# Patient Record
Sex: Male | Born: 1960 | Race: White | Hispanic: No | Marital: Married | Smoking: Never smoker
Health system: Southern US, Community
[De-identification: ages and names within clinical notes are randomized; demographics above are authoritative.]

---

## 1997-09-07 ENCOUNTER — Ambulatory Visit (HOSPITAL_BASED_OUTPATIENT_CLINIC_OR_DEPARTMENT_OTHER): Admission: RE | Admit: 1997-09-07 | Discharge: 1997-09-07 | Payer: Self-pay | Admitting: General Surgery

## 2003-06-09 ENCOUNTER — Emergency Department (HOSPITAL_COMMUNITY): Admission: EM | Admit: 2003-06-09 | Discharge: 2003-06-09 | Payer: Self-pay | Admitting: *Deleted

## 2004-08-26 ENCOUNTER — Ambulatory Visit (HOSPITAL_COMMUNITY): Admission: RE | Admit: 2004-08-26 | Discharge: 2004-08-26 | Payer: Self-pay | Admitting: Family Medicine

## 2017-12-16 ENCOUNTER — Encounter: Payer: Self-pay | Admitting: Cardiology

## 2018-08-10 ENCOUNTER — Other Ambulatory Visit: Payer: Self-pay

## 2018-08-10 ENCOUNTER — Ambulatory Visit (INDEPENDENT_AMBULATORY_CARE_PROVIDER_SITE_OTHER): Payer: 59 | Admitting: Cardiology

## 2018-08-10 ENCOUNTER — Encounter: Payer: Self-pay | Admitting: Cardiology

## 2018-08-10 DIAGNOSIS — E782 Mixed hyperlipidemia: Secondary | ICD-10-CM | POA: Diagnosis not present

## 2018-08-10 DIAGNOSIS — R079 Chest pain, unspecified: Secondary | ICD-10-CM

## 2018-08-10 DIAGNOSIS — R0789 Other chest pain: Secondary | ICD-10-CM | POA: Diagnosis not present

## 2018-08-10 NOTE — Patient Instructions (Addendum)
Medication Instructions:  Your physician recommends that you continue on your current medications as directed. Please refer to the Current Medication list given to you today.  If you need a refill on your cardiac medications before your next appointment, please call your pharmacy.   Lab work: NONE If you have labs (blood work) drawn today and your tests are completely normal, you will receive your results only by: Marland Kitchen MyChart Message (if you have MyChart) OR . A paper copy in the mail If you have any lab test that is abnormal or we need to change your treatment, we will call you to review the results.  Testing/Procedures You had an EKG performed today.  You are scheduled to have CT calcium score procedure performed on Sept. 2, 2020 at 4 pm.               . THERE IS A $150 balance due on day of procedure.    Your physician has requested that you have a lexiscan myoview. For further information please visit [email or web address]. Please follow instruction sheet, as given.   Follow-Up: At [facility], you and your health needs are our priority.  As part of our continuing mission to provide you with exceptional heart care, we have created designated Provider Care Teams.  These Care Teams include your primary Cardiologist (physician) and Advanced Practice Providers (APPs -  Physician Assistants and Nurse Practitioners) who all work together to provide you with the care you need, when you need it. You will need a follow up appointment in 3 months.   Any Other Special Instructions Will Be Listed Below   Coronary Calcium Scan A coronary calcium scan is an imaging test used to look for deposits of calcium and other fatty materials (plaques) in the inner lining of the blood vessels of the heart (coronary arteries). These deposits of calcium and plaques can partly clog and narrow the coronary arteries without producing any symptoms or warning signs. This puts a person at risk for a heart attack. This  test can detect these deposits before symptoms develop. Tell a health care provider about:  Any allergies you have.  All medicines you are taking, including vitamins, herbs, eye drops, creams, and over-the-counter medicines.  Any problems you or family members have had with anesthetic medicines.  Any blood disorders you have.  Any surgeries you have had.  Any medical conditions you have.  Whether you are pregnant or may be pregnant. What are the risks? Generally, this is a safe procedure. However, problems may occur, including:  Harm to a pregnant woman and her unborn baby. This test involves the use of radiation. Radiation exposure can be dangerous to a pregnant woman and her unborn baby. If you are pregnant, you generally should not have this procedure done.  Slight increase in the risk of cancer. This is because of the radiation involved in the test. What happens before the procedure? No preparation is needed for this procedure. What happens during the procedure?   You will undress and remove any jewelry around your neck or chest.  You will put on a hospital gown.  Sticky electrodes will be placed on your chest. The electrodes will be connected to an electrocardiogram (ECG) machine to record a tracing of the electrical activity of your heart.  A CT scanner will take pictures of your heart. During this time, you will be asked to lie still and hold your breath for 2-3 seconds while a picture of your heart is being  taken. The procedure may vary among health care providers and hospitals. What happens after the procedure?  You can get dressed.  You can return to your normal activities.  It is up to you to get the results of your test. Ask your health care provider, or the department that is doing the test, when your results will be ready. Summary  A coronary calcium scan is an imaging test used to look for deposits of calcium and other fatty materials (plaques) in the inner  lining of the blood vessels of the heart (coronary arteries).  Generally, this is a safe procedure. Tell your health care provider if you are pregnant or may be pregnant.  No preparation is needed for this procedure.  A CT scanner will take pictures of your heart.  You can return to your normal activities after the scan is done. This information is not intended to replace advice given to you by your health care provider. Make sure you discuss any questions you have with your health care provider. Document Released: 06/20/2007 Document Revised: 12/04/2016 Document Reviewed: 11/11/2015 Elsevier Patient Education  2020 Elsevier Inc.   Regadenoson injection What is this medicine? REGADENOSON is used to test the heart for coronary artery disease. It is used in patients who can not exercise for their stress test. This medicine may be used for other purposes; ask your health care provider or pharmacist if you have questions. COMMON BRAND NAME(S): Lexiscan What should I tell my health care provider before I take this medicine? They need to know if you have any of these conditions:  heart problems  lung or breathing disease, like asthma or COPD  an unusual or allergic reaction to regadenoson, other medicines, foods, dyes, or preservatives  pregnant or trying to get pregnant  breast-feeding How should I use this medicine? This medicine is for injection into a vein. It is given by a health care professional in a hospital or clinic setting. Talk to your pediatrician regarding the use of this medicine in children. Special care may be needed. Overdosage: If you think you have taken too much of this medicine contact a poison control center or emergency room at once. NOTE: This medicine is only for you. Do not share this medicine with others. What if I miss a dose? This does not apply. What may interact with this medicine?  caffeine  dipyridamole  guarana  theophylline This list may  not describe all possible interactions. Give your health care provider a list of all the medicines, herbs, non-prescription drugs, or dietary supplements you use. Also tell them if you smoke, drink alcohol, or use illegal drugs. Some items may interact with your medicine. What should I watch for while using this medicine? Your condition will be monitored carefully while you are receiving this medicine. Do not take medicines, foods, or drinks with caffeine (like coffee, tea, or colas) for at least 12 hours before your test. If you do not know if something contains caffeine, ask your health care professional. What side effects may I notice from receiving this medicine? Side effects that you should report to your doctor or health care professional as soon as possible:  allergic reactions like skin rash, itching or hives, swelling of the face, lips, or tongue  breathing problems  chest pain, tightness or palpitations  severe headache Side effects that usually do not require medical attention (report to your doctor or health care professional if they continue or are bothersome):  flushing  headache  irritation  or pain at site where injected  nausea, vomiting This list may not describe all possible side effects. Call your doctor for medical advice about side effects. You may report side effects to FDA at 1-800-FDA-1088. Where should I keep my medicine? This drug is given in a hospital or clinic and will not be stored at home. NOTE: This sheet is a summary. It may not cover all possible information. If you have questions about this medicine, talk to your doctor, pharmacist, or health care provider.  2020 Elsevier/Gold Standard (2007-08-22 15:08:13)    Cardiac Nuclear Scan A cardiac nuclear scan is a test that is done to check the flow of blood to your heart. It is done when you are resting and when you are exercising. The test looks for problems such as:  Not enough blood reaching a  portion of the heart.  The heart muscle not working as it should. You may need this test if:  You have heart disease.  You have had lab results that are not normal.  You have had heart surgery or a balloon procedure to open up blocked arteries (angioplasty).  You have chest pain.  You have shortness of breath. In this test, a special dye (tracer) is put into your bloodstream. The tracer will travel to your heart. A camera will then take pictures of your heart to see how the tracer moves through your heart. This test is usually done at a hospital and takes 2-4 hours. Tell a doctor about:  Any allergies you have.  All medicines you are taking, including vitamins, herbs, eye drops, creams, and over-the-counter medicines.  Any problems you or family members have had with anesthetic medicines.  Any blood disorders you have.  Any surgeries you have had.  Any medical conditions you have.  Whether you are pregnant or may be pregnant. What are the risks? Generally, this is a safe test. However, problems may occur, such as:  Serious chest pain and heart attack. This is only a risk if the stress portion of the test is done.  Rapid heartbeat.  A feeling of warmth in your chest. This feeling usually does not last long.  Allergic reaction to the tracer. What happens before the test?  Ask your doctor about changing or stopping your normal medicines. This is important.  Follow instructions from your doctor about what you cannot eat or drink.  Remove your jewelry on the day of the test. What happens during the test?  An IV tube will be inserted into one of your veins.  Your doctor will give you a small amount of tracer through the IV tube.  You will wait for 20-40 minutes while the tracer moves through your bloodstream.  Your heart will be monitored with an electrocardiogram (ECG).  You will lie down on an exam table.  Pictures of your heart will be taken for about 15-20  minutes.  You may also have a stress test. For this test, one of these things may be done: ? You will be asked to exercise on a treadmill or a stationary bike. ? You will be given medicines that will make your heart work harder. This is done if you are unable to exercise.  When blood flow to your heart has peaked, a tracer will again be given through the IV tube.  After 20-40 minutes, you will get back on the exam table. More pictures will be taken of your heart.  Depending on the tracer that is used, more  pictures may need to be taken 3-4 hours later.  Your IV tube will be removed when the test is over. The test may vary among doctors and hospitals. What happens after the test?  Ask your doctor: ? Whether you can return to your normal schedule, including diet, activities, and medicines. ? Whether you should drink more fluids. This will help to remove the tracer from your body. Drink enough fluid to keep your pee (urine) pale yellow.  Ask your doctor, or the department that is doing the test: ? When will my results be ready? ? How will I get my results? Summary  A cardiac nuclear scan is a test that is done to check the flow of blood to your heart.  Tell your doctor whether you are pregnant or may be pregnant.  Before the test, ask your doctor about changing or stopping your normal medicines. This is important.  Ask your doctor whether you can return to your normal activities. You may be asked to drink more fluids. This information is not intended to replace advice given to you by your health care provider. Make sure you discuss any questions you have with your health care provider. Document Released: 06/07/2017 Document Revised: 04/13/2018 Document Reviewed: 06/07/2017 Elsevier Patient Education  2020 [employer]

## 2018-08-10 NOTE — Addendum Note (Signed)
Addended by: Beckey Rutter on: 08/10/2018 09:12 AM   Modules accepted: Orders

## 2018-08-10 NOTE — Progress Notes (Signed)
Cardiology Office Note:    Date:  08/10/2018   ID:  Chris Gonzalez, DOB May 07, 1960, MRN [id number]  PCP:  Tally JoeSwayne, David, MD  Cardiologist:  Garwin Brothersajan R Revankar, MD   Referring MD: Clementeen GrahamScifres, Dorothy, PA-C    ASSESSMENT:    1. Chest discomfort   2. Mixed dyslipidemia    PLAN:    In order of problems listed above:  1. Chest discomfort: Patient symptoms are atypical.  However in view of multiple risk factors I will set him up for a Lexiscan sestamibi.  He knows to go to the nearest emergency room for any concerning symptoms.  I also mentioned to him that it would be a good idea to get a calcium score for risk stratification and he is agreeable. 2. Mixed dyslipidemia: Diet was discussed for dyslipidemia.  Lipids are managed by his primary care physician.  I told him in view of risk factors it might be a better idea to get a more potent statin such as rosuvastatin or atorvastatin in view of current guidelines and he will discuss this with his primary care physician. 3. Patient will be seen in follow-up appointment in 3 months or earlier if the patient has any concerns    Medication Adjustments/Labs and Tests Ordered: Current medicines are reviewed at length with the patient today.  Concerns regarding medicines are outlined above.  No orders of the defined types were placed in this encounter.  No orders of the defined types were placed in this encounter.    History of Present Illness:    Chris ParrGeoffrey Gonzalez is a 58 y.o. male who is being seen today for the evaluation of chest discomfort at the request of Scifres, Nicole CellaDorothy, [place]C.  Patient is a pleasant [age] year old male.  She has past medical history of mixed dyslipidemia.  He mentions to me that he had chest tightness on 2 occasions.  He woke up from his sleep once.  No radiation to the neck or to the arms.  He also had another episode when he was working on his computer.  These do not come on exertion.  No orthopnea or PND.  He mows his  grass with a push mower without any symptoms.  At the time of my evaluation, the patient is alert awake oriented and in no distress.  History reviewed. No pertinent past medical history.  History reviewed. No pertinent surgical history.  Current Medications: Current Meds  Medication Sig  . cetirizine (ZYRTEC) 10 MG tablet Take 10 mg by mouth daily.  Marland Kitchen. desonide (DESOWEN) 0.05 % cream Apply topically 2 (two) times daily.  Marland Kitchen. lovastatin (MEVACOR) 40 MG tablet Take 40 mg by mouth daily.  . Multiple Vitamins-Minerals (MULTIVITAMIN WITH MINERALS) tablet Take 1 tablet by mouth daily.     Allergies:   Tadalafil   Social History   Socioeconomic History  . Marital status: Married    Spouse name: Not on file  . Number of children: Not on file  . Years of education: Not on file  . Highest education level: Not on file  Occupational History  . Not on file  Social Needs  . Financial resource strain: Not on file  . Food insecurity    Worry: Not on file    Inability: Not on file  . Transportation needs    Medical: Not on file    Non-medical: Not on file  Tobacco Use  . Smoking status: Never Smoker  . Smokeless tobacco: Never Used  Substance and Sexual Activity  .  Alcohol use: Yes  . Drug use: Never  . Sexual activity: Not on file  Lifestyle  . Physical activity    Days per week: Not on file    Minutes per session: Not on file  . Stress: Not on file  Relationships  . Social Herbalist on phone: Not on file    Gets together: Not on file    Attends religious service: Not on file    Active member of club or organization: Not on file    Attends meetings of clubs or organizations: Not on file    Relationship status: Not on file  Other Topics Concern  . Not on file  Social History Narrative  . Not on file     Family History: The patient's family history includes Arrhythmia in his mother; Hypertension in his mother.  ROS:   Please see the history of present illness.     All other systems reviewed and are negative.  EKGs/Labs/Other Studies Reviewed:    The following studies were reviewed today: EKG reveals sinus rhythm and nonspecific ST-T changes   Recent Labs: No results found for requested labs within last 8760 hours.  Recent Lipid Panel No results found for: CHOL, TRIG, HDL, CHOLHDL, VLDL, LDLCALC, LDLDIRECT  Physical Exam:    VS:  BP 128/76   Pulse 67   Ht 6\' 1"  (1.854 m)   Wt 214 lb (97.1 kg)   SpO2 97%   BMI 28.23 kg/m     Wt Readings from Last 3 Encounters:  08/10/18 214 lb (97.1 kg)     GEN: Patient is in no acute distress HEENT: Normal NECK: No JVD; No carotid bruits LYMPHATICS: No lymphadenopathy CARDIAC: S1 S2 regular, 2/6 systolic murmur at the apex. RESPIRATORY:  Clear to auscultation without rales, wheezing or rhonchi  ABDOMEN: Soft, non-tender, non-distended MUSCULOSKELETAL:  No edema; No deformity  SKIN: Warm and dry NEUROLOGIC:  Alert and oriented x 3 PSYCHIATRIC:  Normal affect    Signed, Jenean Lindau, MD  08/10/2018 8:56 AM    [place]

## 2018-08-16 ENCOUNTER — Telehealth (HOSPITAL_COMMUNITY): Payer: Self-pay

## 2018-08-16 NOTE — Telephone Encounter (Signed)
Instructions for the nuclear stress test was left on the patient's answering machine. Asked the patient to call back with any questions. S.Rashidi Loh EMTP

## 2018-08-18 ENCOUNTER — Other Ambulatory Visit: Payer: Self-pay

## 2018-08-18 ENCOUNTER — Ambulatory Visit (HOSPITAL_COMMUNITY): Payer: No Typology Code available for payment source | Attending: Cardiology

## 2018-08-18 VITALS — Ht 73.0 in | Wt 214.0 lb

## 2018-08-18 DIAGNOSIS — R079 Chest pain, unspecified: Secondary | ICD-10-CM

## 2018-08-18 DIAGNOSIS — R0789 Other chest pain: Secondary | ICD-10-CM | POA: Insufficient documentation

## 2018-08-18 LAB — MYOCARDIAL PERFUSION IMAGING
LV dias vol: 105 mL (ref 62–150)
LV sys vol: 44 mL
Peak HR: 85 {beats}/min
Rest HR: 60 {beats}/min
SDS: 1
SRS: 0
SSS: 1
TID: 1.09

## 2018-08-18 MED ORDER — TECHNETIUM TC 99M TETROFOSMIN IV KIT
31.7000 | PACK | Freq: Once | INTRAVENOUS | Status: AC | PRN
  Administered 2018-08-18: 31.7 via INTRAVENOUS
  Filled 2018-08-18: qty 32

## 2018-08-18 MED ORDER — REGADENOSON 0.4 MG/5ML IV SOLN
0.4000 mg | Freq: Once | INTRAVENOUS | Status: AC
  Administered 2018-08-18: 0.4 mg via INTRAVENOUS

## 2018-08-18 MED ORDER — TECHNETIUM TC 99M TETROFOSMIN IV KIT
10.1000 | PACK | Freq: Once | INTRAVENOUS | Status: AC | PRN
  Administered 2018-08-18: 10.1 via INTRAVENOUS
  Filled 2018-08-18: qty 11

## 2018-08-22 ENCOUNTER — Encounter: Payer: Self-pay | Admitting: *Deleted

## 2018-09-07 ENCOUNTER — Ambulatory Visit (INDEPENDENT_AMBULATORY_CARE_PROVIDER_SITE_OTHER)
Admission: RE | Admit: 2018-09-07 | Discharge: 2018-09-07 | Disposition: A | Discharge status: Home / Self Care | Payer: Self-pay | Source: Ambulatory Visit | Attending: Cardiology | Admitting: Cardiology

## 2018-09-07 ENCOUNTER — Other Ambulatory Visit: Payer: Self-pay

## 2018-09-07 DIAGNOSIS — R079 Chest pain, unspecified: Secondary | ICD-10-CM

## 2018-09-08 ENCOUNTER — Telehealth: Payer: Self-pay

## 2018-09-08 NOTE — Telephone Encounter (Signed)
Information relayed, patient states he will touch base with Dr. Marquette Saa office tomorrow. Copy of results sent to Dr. Moreen Fowler.

## 2018-09-08 NOTE — Telephone Encounter (Signed)
-----   Message from Jenean Lindau, MD sent at 09/08/2018  8:08 AM EDT ----- The results of the study is unremarkable.  The CT scan report is excellent as far as heart is concerned.  He has nodules in the lungs which appear unremarkable but this will have to be followed by his primary care physician and please tell him to get in touch with them and send copy to primary care.  Please inform patient. I will discuss in detail at next appointment. Cc  primary care/referring physician Jenean Lindau, MD 09/08/2018 8:07 AM

## 2018-11-22 ENCOUNTER — Ambulatory Visit: Payer: 59 | Admitting: Cardiology

## 2018-11-25 ENCOUNTER — Other Ambulatory Visit: Payer: Self-pay

## 2018-11-25 ENCOUNTER — Encounter: Payer: Self-pay | Admitting: Cardiology

## 2018-11-25 ENCOUNTER — Ambulatory Visit (INDEPENDENT_AMBULATORY_CARE_PROVIDER_SITE_OTHER): Payer: 59 | Admitting: Cardiology

## 2018-11-25 VITALS — BP 136/84 | HR 76 | Ht 73.0 in | Wt 213.0 lb

## 2018-11-25 DIAGNOSIS — E782 Mixed hyperlipidemia: Secondary | ICD-10-CM

## 2018-11-25 DIAGNOSIS — Z1329 Encounter for screening for other suspected endocrine disorder: Secondary | ICD-10-CM

## 2018-11-25 DIAGNOSIS — R0789 Other chest pain: Secondary | ICD-10-CM | POA: Diagnosis not present

## 2018-11-25 NOTE — Progress Notes (Signed)
Cardiology Office Note:    Date:  11/25/2018   ID:  Chris Gonzalez, DOB 1960-04-02, MRN [id number]  PCP:  Tally Joe, MD  Cardiologist:  Garwin Brothers, MD   Referring MD: Tally Joe, MD    ASSESSMENT:    1. Chest discomfort   2. Mixed dyslipidemia    PLAN:    In order of problems listed above:  1. Chest discomfort: This has resolved.  I congratulated him on his active exercise program and he is happy with it. 2. Mixed dyslipidemia: We will get lipid check in the next few days in addition to other blood work.  I will make no recommendations for continuation or discontinuation of lipid therapy based on these numbers and the results of the CT calcium scoring.  I discussed the plan with the patient at length. 3. Patient will be seen in follow-up appointment in 12 months or earlier if the patient has any concerns.  I discussed with the patient about his CT scan report at length and mentioned to him to talk to his primary care doctor about the extracardiac findings.  We will send a copy to his primary care physician.    Medication Adjustments/Labs and Tests Ordered: Current medicines are reviewed at length with the patient today.  Concerns regarding medicines are outlined above.  No orders of the defined types were placed in this encounter.  No orders of the defined types were placed in this encounter.    No chief complaint on file.    History of Present Illness:    Chris Gonzalez is a 58 y.o. male.  Patient was evaluated for chest discomfort and dyslipidemia.  He denies any problems at this time and takes care of activities of daily living.  No chest pain orthopnea or PND.  At the time of my evaluation, the patient is alert awake oriented and in no distress.  His stress test and CT score results are excellent.  History reviewed. No pertinent past medical history.  History reviewed. No pertinent surgical history.  Current Medications: No outpatient  medications have been marked as taking for the 11/25/18 encounter (Office Visit) with Ambriana Selway, Aundra Dubin, MD.     Allergies:   Tadalafil   Social History   Socioeconomic History  . Marital status: Married    Spouse name: Not on file  . Number of children: Not on file  . Years of education: Not on file  . Highest education level: Not on file  Occupational History  . Not on file  Social Needs  . Financial resource strain: Not on file  . Food insecurity    Worry: Not on file    Inability: Not on file  . Transportation needs    Medical: Not on file    Non-medical: Not on file  Tobacco Use  . Smoking status: Never Smoker  . Smokeless tobacco: Never Used  Substance and Sexual Activity  . Alcohol use: Yes  . Drug use: Never  . Sexual activity: Not on file  Lifestyle  . Physical activity    Days per week: Not on file    Minutes per session: Not on file  . Stress: Not on file  Relationships  . Social Musician on phone: Not on file    Gets together: Not on file    Attends religious service: Not on file    Active member of club or organization: Not on file    Attends meetings of clubs or organizations:  Not on file    Relationship status: Not on file  Other Topics Concern  . Not on file  Social History Narrative  . Not on file     Family History: The patient's family history includes Arrhythmia in his mother; Hypertension in his mother.  ROS:   Please see the history of present illness.    All other systems reviewed and are negative.  EKGs/Labs/Other Studies Reviewed:    The following studies were reviewed today: Study Highlights    Nuclear stress EF: 58%.  There was no ST segment deviation noted during stress.  The study is normal.  This is a low risk study.  The left ventricular ejection fraction is normal (55-65%).   Normal stress nuclear study with no ischemia or infarction.  Gated ejection fraction 58% with normal wall motion.   CT  CARDIAC SCORING (Accession [id number]) (Order [id number]) Imaging Date: 08/10/2018 [facility] Ordering/Authorizing: Garwin Brothersevankar, Tim Corriher R, MD  Exam Information  Status Exam Begun  Exam Ended   Final [99] 09/07/2018 3:50 PM 09/07/2018 4:04 PM  PACS Intelerad Image Link  Show images for CT CARDIAC SCORING  Addendum  ADDENDUM REPORT: 09/07/2018 17:15  EXAM: OVER-READ INTERPRETATION  CT CHEST  The following report is an over-read performed by radiologist Dr. Leatha GildingEric Mansellof [facility] on 09/07/2018. This over-read does not include interpretation of cardiac or coronary anatomy or pathology. The coronary calcium score interpretation by the cardiologist is attached.  COMPARISON:  None.  FINDINGS: No evidence for mediastinal or hilar lymphadenopathy within the visualized portions of the chest on this noncontrast exam. Visualized esophagus is unremarkable.  4 mm subpleural right lower lobe nodule visible on 33/3. 3 mm right lower lobe nodule identified on 38/3. 3 mm left lower lobe nodule evident on 45/3.  No worrisome lytic or sclerotic osseous abnormality.  IMPRESSION: Tiny bilateral pulmonary nodules measuring up to maximum 4 mm diameter. No follow-up needed if patient is low-risk (and has no known or suspected primary neoplasm). Non-contrast chest CT can be considered in 12 months if patient is high-risk. This recommendation follows the consensus statement: Guidelines for Management of Incidental Pulmonary Nodules Detected on CT Images: From the Fleischner Society 2017; Radiology 2017; 284:228-243.   Electronically Signed   By: Kennith CenterEric  Mansell M.D.   On: 09/07/2018 17:15   Addended by Kennith CenterMansell, Eric, MD on 09/07/2018 5:17 PM    Study Result  CLINICAL DATA:  Risk stratification  EXAM: Coronary Calcium Score  TECHNIQUE: The patient was scanned on a [employer] scanner. Axial non-contrast 3 mm slices were carried out  through the heart. The data set was analyzed on a dedicated work station and scored using the Agatson method.  FINDINGS: Non-cardiac: See [facility].  Ascending Aorta: Upper limits of normal caliber with no calcifications.  Pericardium: Normal  Coronary arteries: Normal coronary origins of the LM, LAD and LCx. The RCA likely comes off the right coronary cusp but difficult to visualize the ostial takeoff.  IMPRESSION: Coronary calcium score of 0. This was 0 percentile for age and sex matched control.  Armanda Magicraci Turner  Electronically Signed: By: Armanda Magicraci  Turner On: 09/07/2018 16:13         Recent Labs: No results found for requested labs within last 8760 hours.  Recent Lipid Panel No results found for: CHOL, TRIG, HDL, CHOLHDL, VLDL, LDLCALC, LDLDIRECT  Physical Exam:    VS:  BP 136/84 (BP Location: Left Arm, Patient Position: Sitting, Cuff Size:  Normal)   Pulse 76   Ht 6\' 1"  (1.854 m)   Wt 213 lb (96.6 kg)   SpO2 98%   BMI 28.10 kg/m     Wt Readings from Last 3 Encounters:  11/25/18 213 lb (96.6 kg)  08/18/18 214 lb (97.1 kg)  08/10/18 214 lb (97.1 kg)     GEN: Patient is in no acute distress HEENT: Normal NECK: No JVD; No carotid bruits LYMPHATICS: No lymphadenopathy CARDIAC: Hear sounds regular, 2/6 systolic murmur at the apex. RESPIRATORY:  Clear to auscultation without rales, wheezing or rhonchi  ABDOMEN: Soft, non-tender, non-distended MUSCULOSKELETAL:  No edema; No deformity  SKIN: Warm and dry NEUROLOGIC:  Alert and oriented x 3 PSYCHIATRIC:  Normal affect   Signed, Jenean Lindau, MD  11/25/2018 9:01 AM    [place]

## 2018-11-25 NOTE — Addendum Note (Signed)
Addended by: Beckey Rutter on: 11/25/2018 09:20 AM   Modules accepted: Orders

## 2018-11-25 NOTE — Patient Instructions (Signed)
Medication Instructions:  Your physician recommends that you continue on your current medications as directed. Please refer to the Current Medication list given to you today.  *If you need a refill on your cardiac medications before your next appointment, please call your pharmacy*  Lab Work: Your physician recommends that you return FASTING to have a CBC, BMP, TSH, hepatic and lipid drawn  If you have labs (blood work) drawn today and your tests are completely normal, you will receive your results only by: Marland Kitchen MyChart Message (if you have MyChart) OR . A paper copy in the mail If you have any lab test that is abnormal or we need to change your treatment, we will call you to review the results.  Testing/Procedures: NONE  Follow-Up: At [facility], you and your health needs are our priority.  As part of our continuing mission to provide you with exceptional heart care, we have created designated Provider Care Teams.  These Care Teams include your primary Cardiologist (physician) and Advanced Practice Providers (APPs -  Physician Assistants and Nurse Practitioners) who all work together to provide you with the care you need, when you need it.  Your next appointment:   12 month(s)  The format for your next appointment:   In Person  Provider:   Jyl Heinz, MD

## 2018-12-07 LAB — TSH: TSH: 1.35 u[IU]/mL (ref 0.450–4.500)

## 2018-12-07 LAB — HEPATIC FUNCTION PANEL
ALT: 27 IU/L (ref 0–44)
AST: 21 IU/L (ref 0–40)
Albumin: 4.2 g/dL (ref 3.8–4.9)
Alkaline Phosphatase: 74 IU/L (ref 39–117)
Bilirubin Total: 0.2 mg/dL (ref 0.0–1.2)
Bilirubin, Direct: 0.09 mg/dL (ref 0.00–0.40)
Total Protein: 6.9 g/dL (ref 6.0–8.5)

## 2018-12-07 LAB — BASIC METABOLIC PANEL
BUN/Creatinine Ratio: 17 (ref 9–20)
BUN: 14 mg/dL (ref 6–24)
CO2: 25 mmol/L (ref 20–29)
Calcium: 8.8 mg/dL (ref 8.7–10.2)
Chloride: 105 mmol/L (ref 96–106)
Creatinine, Ser: 0.84 mg/dL (ref 0.76–1.27)
GFR calc Af Amer: 112 mL/min/{1.73_m2} (ref >59)
GFR calc non Af Amer: 97 mL/min/{1.73_m2} (ref >59)
Glucose: 90 mg/dL (ref 65–99)
Potassium: 4.4 mmol/L (ref 3.5–5.2)
Sodium: 140 mmol/L (ref 134–144)

## 2018-12-07 LAB — CBC
Hematocrit: 45 % (ref 37.5–51.0)
Hemoglobin: 15.1 g/dL (ref 13.0–17.7)
MCH: 28.7 pg (ref 26.6–33.0)
MCHC: 33.6 g/dL (ref 31.5–35.7)
MCV: 86 fL (ref 79–97)
Platelets: 183 10*3/uL (ref 150–450)
RBC: 5.26 x10E6/uL (ref 4.14–5.80)
RDW: 12.3 % (ref 11.6–15.4)
WBC: 4.9 10*3/uL (ref 3.4–10.8)

## 2018-12-07 LAB — LIPID PANEL
Chol/HDL Ratio: 2.7 ratio (ref 0.0–5.0)
Cholesterol, Total: 150 mg/dL (ref 100–199)
HDL: 56 mg/dL (ref >39)
LDL Chol Calc (NIH): 74 mg/dL (ref 0–99)
Triglycerides: 110 mg/dL (ref 0–149)
VLDL Cholesterol Cal: 20 mg/dL (ref 5–40)

## 2018-12-08 ENCOUNTER — Telehealth: Payer: Self-pay

## 2018-12-08 NOTE — Telephone Encounter (Signed)
Left message that results were good, copy sent to Dr. Moreen Fowler

## 2018-12-08 NOTE — Telephone Encounter (Signed)
-----   Message from Jenean Lindau, MD sent at 12/07/2018  9:29 AM EST ----- The results of the study is unremarkable. Please inform patient. I will discuss in detail at next appointment. Cc  primary care/referring physician Jenean Lindau, MD 12/07/2018 9:28 AM

## 2020-01-10 ENCOUNTER — Other Ambulatory Visit: Payer: Self-pay | Admitting: Critical Care Medicine

## 2020-01-10 ENCOUNTER — Other Ambulatory Visit: Payer: 59

## 2020-01-10 DIAGNOSIS — Z20822 Contact with and (suspected) exposure to covid-19: Secondary | ICD-10-CM

## 2020-01-12 LAB — SARS-COV-2, NAA 2 DAY TAT

## 2020-01-12 LAB — SPECIMEN STATUS REPORT

## 2020-01-12 LAB — NOVEL CORONAVIRUS, NAA: SARS-CoV-2, NAA: NOT DETECTED

## 2020-07-16 IMAGING — CT CT HEART SCORING
2 series · 16 of 20 positions shown, 18 images · non-contrast
Comparison: None.

Addendum:
CLINICAL DATA: Risk stratification

EXAM:
Coronary Calcium Score
TECHNIQUE: The patient was scanned on a Siemens Force scanner. Axial
non-contrast 3 mm slices were carried out through the heart. The
data set was analyzed on a dedicated work station and scored using
the Agatson method.

[Series 2: casc 3.0 i36f 2 bestdiast 71 % · axial · 0.38mm/px · z∈[-251,-131]mm · 8 of 52 slices shown, 10 images]
[im 6/52  vessel]
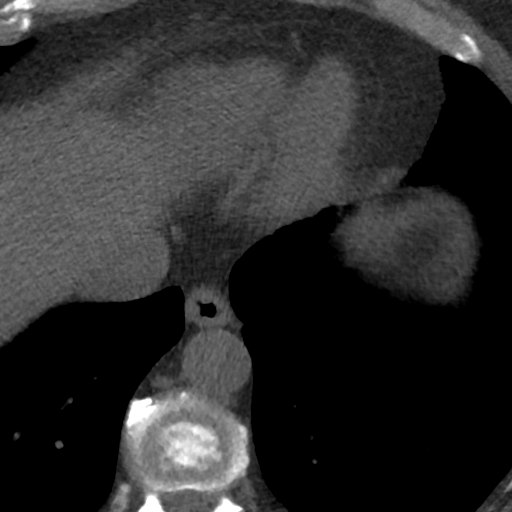
[im 6/52  lung]
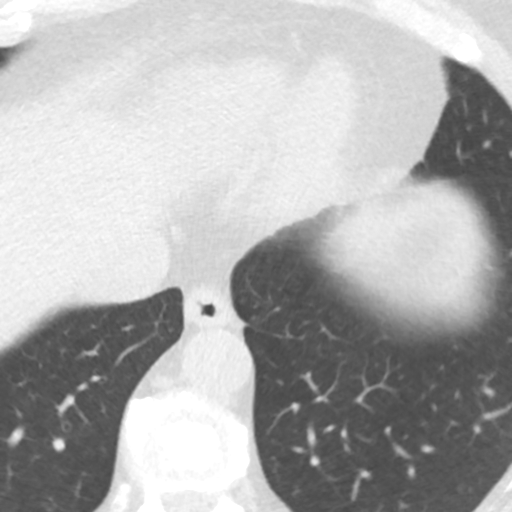
[im 12/52  vessel]
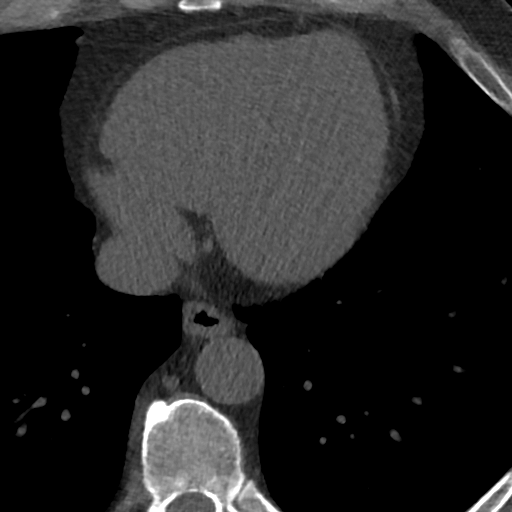
[im 18/52  vessel]
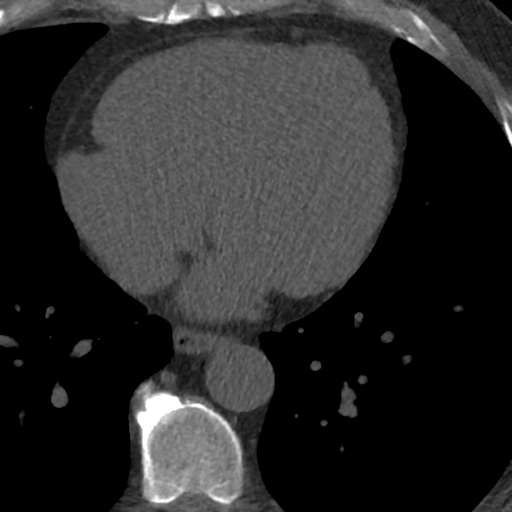
[im 23/52  vessel]
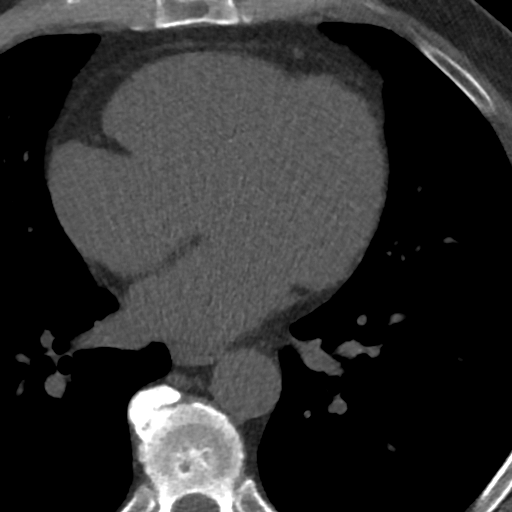
[im 29/52  vessel]
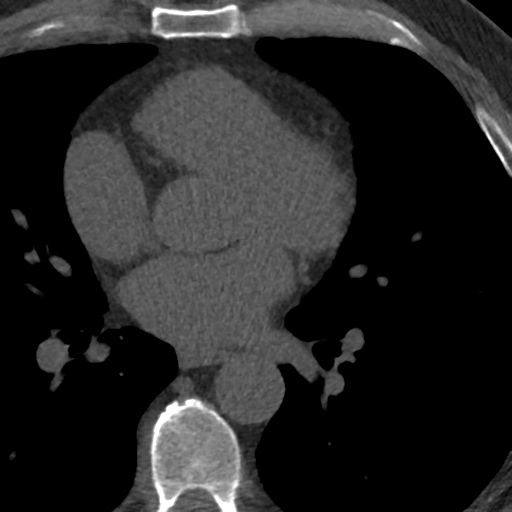
[im 29/52  lung]
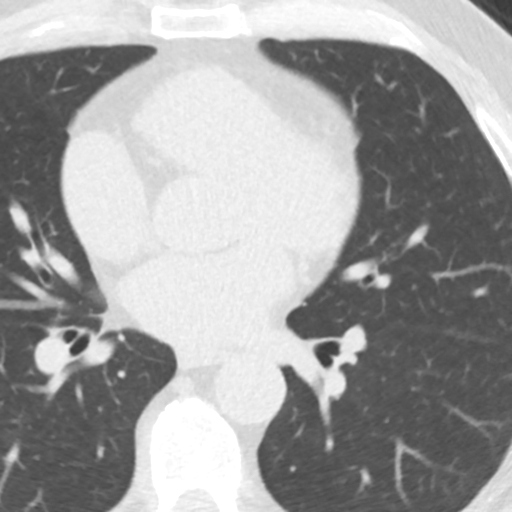
[im 35/52  vessel]
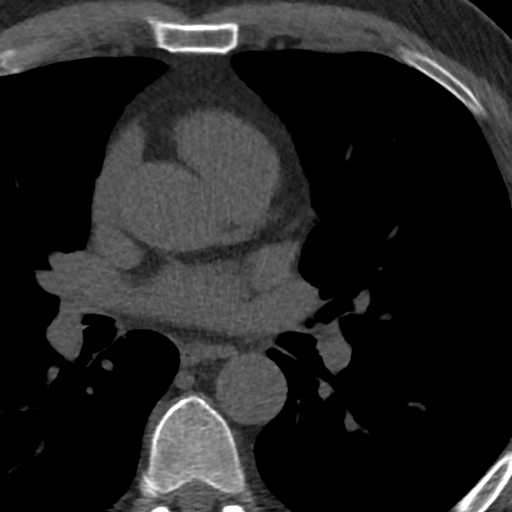
[im 40/52  vessel]
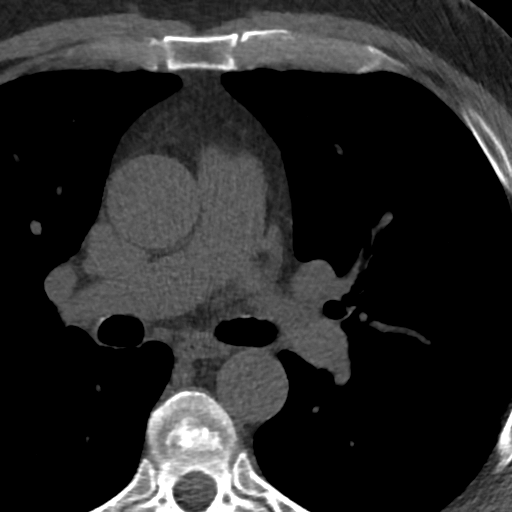
[im 46/52  vessel]
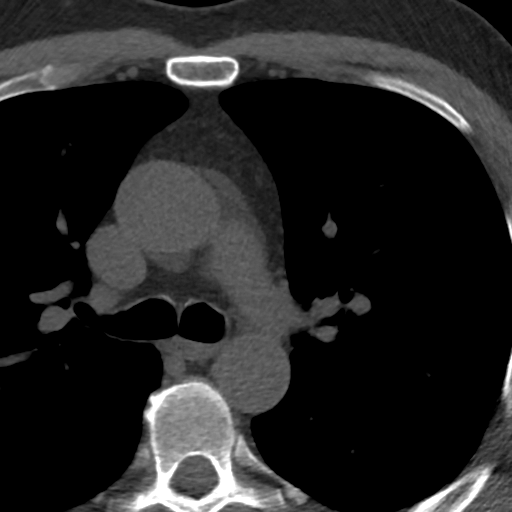

[Series 4: lung st 71 % · axial · 0.70mm/px · z∈[-251,-131]mm · 8 of 52 slices shown]
[im 6/52  lung]
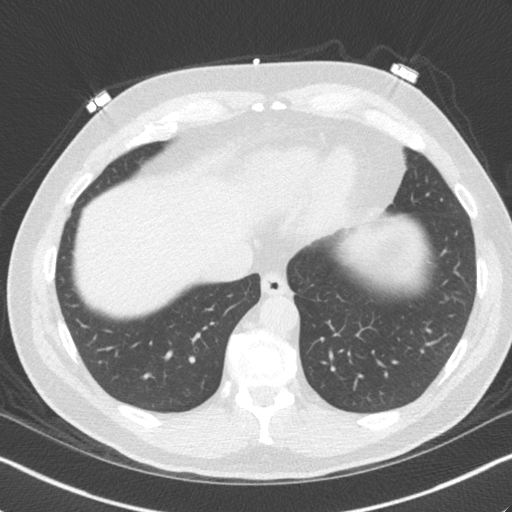
[im 12/52  lung]
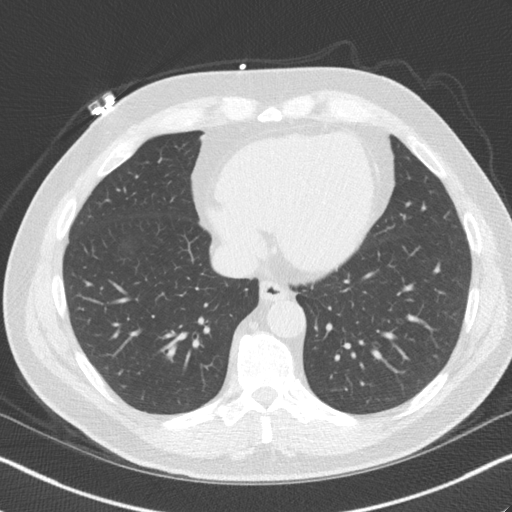
[im 18/52  lung]
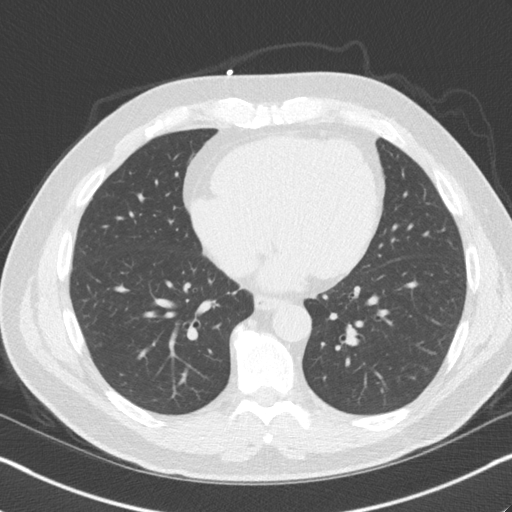
[im 23/52  lung]
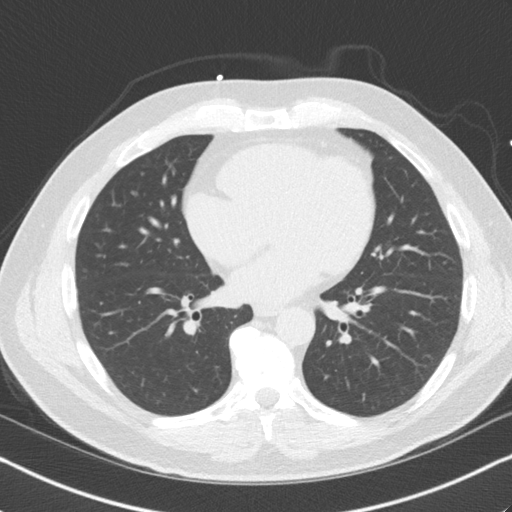
[im 29/52  lung]
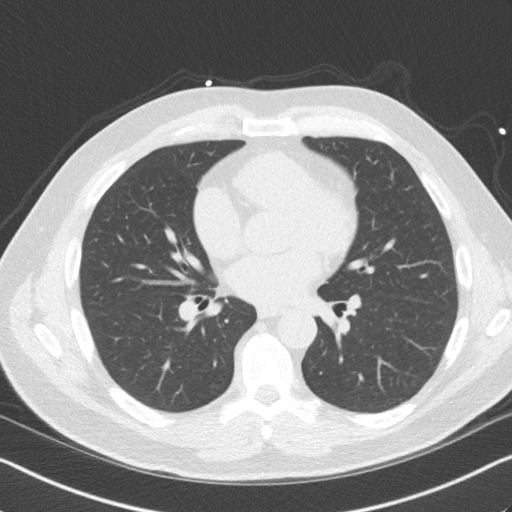
[im 35/52  lung]
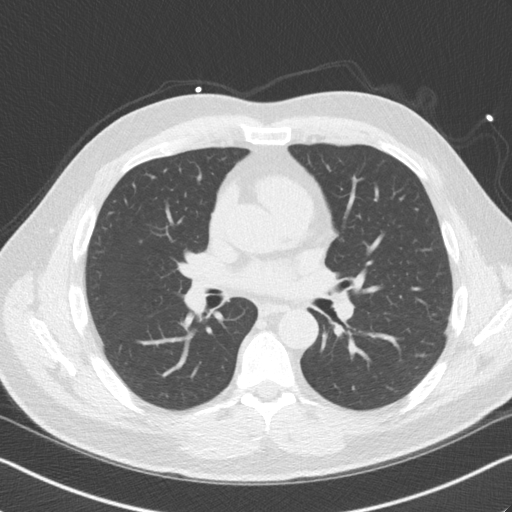
[im 40/52  lung]
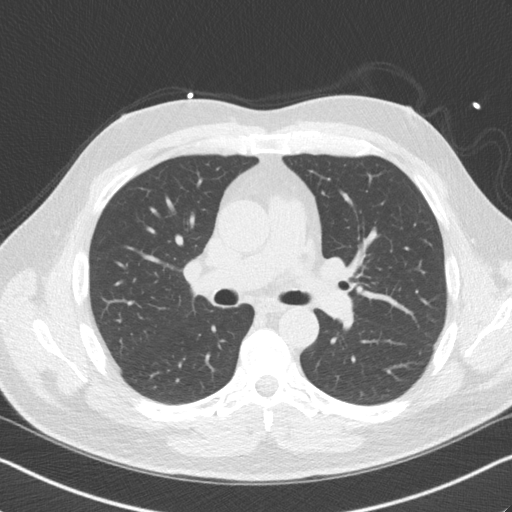
[im 46/52  lung]
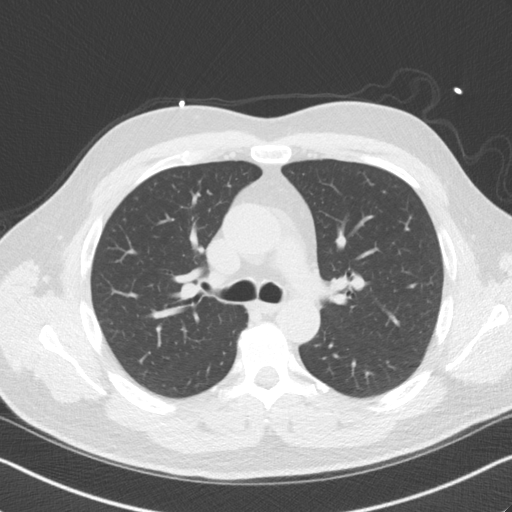

[16 of 20 positions shown; findings below may reference images not displayed]

FINDINGS: Non-cardiac: See separate report from [REDACTED].

Ascending Aorta: Upper limits of normal caliber with no
calcifications.

Pericardium: Normal

Coronary arteries: Normal coronary origins of the LM, LAD and LCx.
The RCA likely comes off the right coronary cusp but difficult to
visualize the ostial takeoff.
IMPRESSION: Coronary calcium score of 0. This was 0 percentile for age and sex
matched control.

Tadayoshi Hyka

EXAM:
OVER-READ INTERPRETATION  CT CHEST

The following report is an over-read performed by radiologist Dr.
Waldemarr Berdychowska [REDACTED] on 09/07/2018. This over-read
does not include interpretation of cardiac or coronary anatomy or
pathology. The coronary calcium score interpretation by the
cardiologist is attached.
FINDINGS: No evidence for mediastinal or hilar lymphadenopathy within the
visualized portions of the chest on this noncontrast exam.
Visualized esophagus is unremarkable.

4 mm subpleural right lower lobe nodule visible on 33/3. 3 mm right
lower lobe nodule identified on 38/3. 3 mm left lower lobe nodule
evident on 45/3.

No worrisome lytic or sclerotic osseous abnormality.
IMPRESSION: Tiny bilateral pulmonary nodules measuring up to maximum 4 mm
diameter. No follow-up needed if patient is low-risk (and has no
known or suspected primary neoplasm). Non-contrast chest CT can be
considered in 12 months if patient is high-risk. This recommendation
follows the consensus statement: Guidelines for Management of
Incidental Pulmonary Nodules Detected on CT Images: From the

*** End of Addendum ***
FINDINGS: Non-cardiac: See separate report from [REDACTED].

Ascending Aorta: Upper limits of normal caliber with no
calcifications.

Pericardium: Normal

Coronary arteries: Normal coronary origins of the LM, LAD and LCx.
The RCA likely comes off the right coronary cusp but difficult to
visualize the ostial takeoff.
IMPRESSION: Coronary calcium score of 0. This was 0 percentile for age and sex
matched control.

Tadayoshi Hyka
